# Patient Record
Sex: Male | Born: 2000 | Race: White | Hispanic: No | Marital: Single | Smoking: Never smoker
Health system: Southern US, Community
[De-identification: ages and names within clinical notes are randomized; demographics above are authoritative.]

## PROBLEM LIST (undated history)

## (undated) DIAGNOSIS — F419 Anxiety disorder, unspecified: Secondary | ICD-10-CM

## (undated) HISTORY — PX: NO PAST SURGERIES: SHX2092

---
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
## 2022-09-05 ENCOUNTER — Encounter: Payer: Self-pay | Admitting: Surgery

## 2022-09-05 ENCOUNTER — Other Ambulatory Visit: Payer: Self-pay

## 2022-09-05 ENCOUNTER — Ambulatory Visit (INDEPENDENT_AMBULATORY_CARE_PROVIDER_SITE_OTHER): Payer: BC Managed Care – PPO | Admitting: Surgery

## 2022-09-05 VITALS — BP 101/71 | HR 101 | Temp 98.5°F | Ht 73.0 in | Wt 220.0 lb

## 2022-09-05 DIAGNOSIS — L0591 Pilonidal cyst without abscess: Secondary | ICD-10-CM | POA: Diagnosis not present

## 2022-09-05 NOTE — Progress Notes (Signed)
09/05/2022  Reason for Visit:  Recurrent pilonidal cyst  History of Present Illness: Evan Leon is a 21 y.o. male presenting for evaluation of a recurrent pilonidal cyst.  The patient had a minimally invasive pilonidal cyst excision in January 2020.  He healed well and had no issues until about 2 weeks ago.  He reports he was driving from [place] to [facility] and noticed some discomfort in the gluteal cleft area.  This lasted for about two days and was sore when he would sit down.  This resolved on its own.  Denies any drainage from the area, or open wounds, worsening pain, fevers, or chills.  Past Medical History: Past Medical History:  Diagnosis Date   Anxiety    PT PASSES OUT WHEN HE SEES BLOOD     Past Surgical History: Past Surgical History:  Procedure Laterality Date   NO PAST SURGERIES     PILONIDAL CYST EXCISION N/A 10/31/2018   Procedure: CYST EXCISION PILONIDAL;  Surgeon: Henrene Dodge, MD;  Location: [facility] ORS;  Service: General;  Laterality: N/A;    Home Medications: Prior to Admission medications   Medication Sig Start Date End Date Taking? Authorizing Provider  ibuprofen (ADVIL,MOTRIN) 600 MG tablet Take 1 tablet (600 mg total) by mouth every 8 (eight) hours as needed. 10/31/18   Henrene Dodge, MD    Allergies: Allergies  Allergen Reactions   Penicillins Swelling and Rash    Did it involve swelling of the face/tongue/throat, SOB, or low BP? Unknown Did it involve sudden or severe rash/hives, skin peeling, or any reaction on the inside of your mouth or nose? No Did you need to seek medical attention at a hospital or doctor's office? No When did it last happen?   15 years ago    If all above answers are "NO", may proceed with cephalosporin use.    Penicillin G Swelling    Social History:  reports that he has never smoked. He has never used smokeless tobacco. He reports that he does not drink alcohol and does not use drugs.   Family History: Family History  Problem  Relation Age of Onset   Heart disease Maternal Grandfather    Heart disease Paternal Grandfather     Review of Systems: Review of Systems  Constitutional:  Negative for chills and fever.  Respiratory:  Negative for shortness of breath.   Cardiovascular:  Negative for chest pain.  Gastrointestinal:  Negative for abdominal pain, nausea and vomiting.  Skin:        Sore area in gluteal cleft    Physical Exam BP 101/71   Pulse (!) 101   Temp 98.5 F (36.9 C) (Oral)   Ht 6\' 1"  (1.854 m)   Wt 220 lb (99.8 kg)   SpO2 97%   BMI 29.03 kg/m  CONSTITUTIONAL: No acute distress, well nourished. HEENT:  Normocephalic, atraumatic, extraocular motion intact. RESPIRATORY:  Normal respiratory effort without pathologic use of accessory muscles. CARDIOVASCULAR: Regular rhythm and rate. MUSCULOSKELETAL:  Normal muscle strength and tone in all four extremities.  No peripheral edema or cyanosis. SKIN: The gluteal cleft has well healed scars from his initial surgery.  At the inferior portion, the patient has two new sinus pits.  Very fine hair was noted inside and cleaned off.  No erythema, drainage, or induration noted. NEUROLOGIC:  Motor and sensation is grossly normal.  Cranial nerves are grossly intact. PSYCH:  Alert and oriented to person, place and time. Affect is normal.   Assessment and Plan:  This is a 21 y.o. male with recurrent pilonidal cyst  --Discussed with the patient that it appears he had two new areas where the pilonidal cyst is recurring, with two sinues noted.  There is no significant swelling and there is no drainage or erythema.  At this point no surgery is urgently needed.  Discussed the importance of hygiene of the area to prevent the sinuses from getting clogged.  Discussed that if the area is starting to become more bothersome we could proceed with minimally invasive procedure again, vs formal cyst excision.  He would prefer the minimally invasive form if needed in the  future. --He will call us if any worsening.  Return precautions given.  I spent 30 minutes dedicated to the care of this patient on the date of this encounter to include pre-visit review of records, face-to-face time with the patient discussing diagnosis and management, and any post-visit coordination of care.   Howie Ill, MD [facility]

## 2022-09-05 NOTE — Patient Instructions (Addendum)
If you have any concerns or questions, please feel free to call our office.   Pilonidal Cyst Drainage  A pilonidal cyst is a fluid-filled sac that forms under the skin near the tailbone, at the top of the crease of the buttocks (pilonidal area). Incision and drainage is a procedure to open and drain a pilonidal cyst. You may need this procedure if your cyst becomes infected (pilonidal abscess). A pilonidal abscess may cause pain and swelling. There are three types of procedures to drain a pilonidal cyst. The type of procedure you have will depend on the size, location, and severity of your cyst. You may have: Incision and drainage with wound packing. Wound packing involves placing a germ-free packing material (gauze) into the incision. Marsupialization. This involves opening and draining the cyst. The edges of the incision are then stitched (sutured) to the skin around the incision. Incision and drainage without wound packing. The incision is closed and covered with a dressing. Tell a health care provider about: Any allergies you have. All medicines you are taking, including vitamins, herbs, eye drops, creams, and over-the-counter medicines. Any problems you or family members have had with anesthetic medicines. Any bleeding problems you have. Any surgeries you have had. Any medical conditions you have. Whether you are pregnant or may be pregnant. What are the risks? Talk to your health care provider about risks. These may include: Infection. Bleeding. Allergic reactions to medicines. The cyst coming back again (recurrence). What happens before the procedure? When to stop eating and drinking Follow instructions from your health care provider about what you may eat and drink. These may include: 8 hours before your procedure Stop eating most foods. Do not eat meat, fried foods, or fatty foods. Eat only light foods, such as toast or crackers. All liquids are okay except energy drinks and  alcohol. 6 hours before your procedure Stop eating. Drink only clear liquids, such as water, clear fruit juice, black coffee, plain tea, and sports drinks. Do not drink energy drinks or alcohol. 2 hours before your procedure Stop drinking all liquids. You may be allowed to take medicines with small sips of water. If you do not follow your health care provider's instructions, your procedure may be delayed or canceled. Medicines Ask your health care provider about: Changing or stopping your regular medicines. These include any diabetes medicines or blood thinners you take. Taking medicines such as aspirin and ibuprofen. These medicines can thin your blood. Do not take them unless your health care provider tells you to. Taking over-the-counter medicines, vitamins, herbs, and supplements. Surgery safety Ask your health care provider: How your surgery site will be marked. What steps will be taken to help prevent infection. These steps may include: Removing hair at the surgery site. Washing skin with a soap that kills germs. Taking antibiotics. General instructions Do not use any products that contain nicotine or tobacco for at least 4 weeks before the procedure. These products include cigarettes, chewing tobacco, and vaping devices, such as e-cigarettes. If you need help quitting, ask your health care provider. If you will be going home right after the procedure, plan to have a responsible adult: Take you home from the hospital or clinic. You will not be allowed to drive. Care for you for the time you are told. You may need help with wound care and dressing changes. What happens during the procedure? An IV may be inserted into one of your veins. You may be given: A sedative. This helps you relax.  Anesthesia. This will: Numb certain areas of your body. Make you fall asleep for surgery. You will lie down on your stomach. Tape may be used to spread your buttocks. The area around the cyst  will be cleaned. An incision will be made in the cyst. Your surgeon may insert a tube with a light and camera on the end (probe). This is done to see how deep the cyst is. Fluid or pus inside the cyst will be drained. The cyst will be flushed out (irrigated). The rest of the procedure will depend on the type of procedure you are having. For incision and drainage with wound packing: Gauze will be placed into the wound. This keeps the wound open so it can keep draining after surgery. It also helps the wound heal from the inside out. The area will be covered with a dressing. For marsupialization: The edges of the incision will be stitched to your skin. This keeps the wound open while it heals. It also helps deep wounds heal from the inside out. A dressing will be placed over the wound. For incision and drainage without wound packing: Some tissue around the cyst may be removed. The incision may be closed with sutures, skin glue, or adhesive strips. The incision will be covered with a dressing. These procedures may vary among health care providers and hospitals. What happens after the procedure? Your blood pressure, heart rate, breathing rate, and blood oxygen level will be monitored until you leave the hospital or clinic. You will be given medicine for pain as needed. If you were given a sedative during the procedure, it can affect you for several hours. Do not drive or operate machinery until your health care provider says that it is safe. Summary A pilonidal cyst is a fluid-filled sac that forms under the skin near the tailbone, at the top of the crease of the buttocks (pilonidal area). Incision and drainage is a procedure to open and drain the cyst. The type of procedure you have will depend on the size, location, and severity of your cyst. You may need this procedure if your cyst becomes infected (pilonidal abscess). This information is not intended to replace advice given to you by your  health care provider. Make sure you discuss any questions you have with your health care provider. Document Revised: 12/22/2021 Document Reviewed: 12/22/2021 Elsevier Patient Education  2023 [employer].

## 2023-01-02 ENCOUNTER — Other Ambulatory Visit: Payer: Self-pay | Admitting: Family Medicine

## 2023-01-02 DIAGNOSIS — G40802 Other epilepsy, not intractable, without status epilepticus: Secondary | ICD-10-CM

## 2023-01-14 ENCOUNTER — Other Ambulatory Visit (HOSPITAL_BASED_OUTPATIENT_CLINIC_OR_DEPARTMENT_OTHER): Payer: Self-pay

## 2023-01-14 DIAGNOSIS — R0681 Apnea, not elsewhere classified: Secondary | ICD-10-CM

## 2023-01-26 ENCOUNTER — Ambulatory Visit
Admission: RE | Admit: 2023-01-26 | Discharge: 2023-01-26 | Disposition: A | Discharge status: Home / Self Care | Payer: BC Managed Care – PPO | Source: Ambulatory Visit | Attending: Family Medicine | Admitting: Family Medicine

## 2023-01-26 DIAGNOSIS — G40802 Other epilepsy, not intractable, without status epilepticus: Secondary | ICD-10-CM

## 2023-01-26 MED ORDER — GADOPICLENOL 0.5 MMOL/ML IV SOLN
10.0000 mL | Freq: Once | INTRAVENOUS | Status: AC | PRN
  Administered 2023-01-26: 10 mL via INTRAVENOUS

## 2023-05-22 ENCOUNTER — Ambulatory Visit: Payer: BC Managed Care – PPO | Attending: Otolaryngology

## 2023-05-22 DIAGNOSIS — G4733 Obstructive sleep apnea (adult) (pediatric): Secondary | ICD-10-CM | POA: Diagnosis present

## 2023-05-22 DIAGNOSIS — G4761 Periodic limb movement disorder: Secondary | ICD-10-CM | POA: Diagnosis not present

## 2023-05-22 DIAGNOSIS — G4736 Sleep related hypoventilation in conditions classified elsewhere: Secondary | ICD-10-CM | POA: Insufficient documentation
